# Patient Record
Sex: Female | Born: 1985
Health system: Southern US, Community
[De-identification: ages and names within clinical notes are randomized; demographics above are authoritative.]

## PROBLEM LIST (undated history)

## (undated) DIAGNOSIS — G8929 Other chronic pain: Secondary | ICD-10-CM

## (undated) DIAGNOSIS — M549 Dorsalgia, unspecified: Secondary | ICD-10-CM

## (undated) HISTORY — PX: HERNIA REPAIR: SHX51

---

## 2014-05-10 ENCOUNTER — Emergency Department (HOSPITAL_BASED_OUTPATIENT_CLINIC_OR_DEPARTMENT_OTHER)
Admission: EM | Admit: 2014-05-10 | Discharge: 2014-05-10 | Disposition: A | Discharge status: Home / Self Care | Payer: Medicaid Other | Attending: Emergency Medicine | Admitting: Emergency Medicine

## 2014-05-10 ENCOUNTER — Encounter (HOSPITAL_BASED_OUTPATIENT_CLINIC_OR_DEPARTMENT_OTHER): Payer: Self-pay | Admitting: Emergency Medicine

## 2014-05-10 DIAGNOSIS — L0502 Pilonidal sinus with abscess: Secondary | ICD-10-CM | POA: Diagnosis not present

## 2014-05-10 DIAGNOSIS — Z79899 Other long term (current) drug therapy: Secondary | ICD-10-CM | POA: Diagnosis not present

## 2014-05-10 DIAGNOSIS — L0231 Cutaneous abscess of buttock: Secondary | ICD-10-CM | POA: Diagnosis present

## 2014-05-10 DIAGNOSIS — L0501 Pilonidal cyst with abscess: Secondary | ICD-10-CM

## 2014-05-10 MED ORDER — HYDROCODONE-ACETAMINOPHEN 5-325 MG PO TABS
1.0000 | ORAL_TABLET | ORAL | Status: DC | PRN
Start: 2014-05-10 — End: 2016-04-05

## 2014-05-10 MED ORDER — LIDOCAINE-EPINEPHRINE (PF) 2 %-1:200000 IJ SOLN
10.0000 mL | Freq: Once | INTRAMUSCULAR | Status: AC
  Administered 2014-05-10: 10 mL via INTRADERMAL
  Filled 2014-05-10: qty 20

## 2014-05-10 MED ORDER — SULFAMETHOXAZOLE-TRIMETHOPRIM 800-160 MG PO TABS: 1.0000 | ORAL_TABLET | Freq: Two times a day (BID) | ORAL | Status: AC

## 2014-05-10 MED ORDER — OXYCODONE-ACETAMINOPHEN 5-325 MG PO TABS
2.0000 | ORAL_TABLET | Freq: Once | ORAL | Status: AC
  Administered 2014-05-10: 2 via ORAL
  Filled 2014-05-10: qty 2

## 2014-05-10 NOTE — ED Provider Notes (Signed)
Medical screening examination/treatment/procedure(s) were performed by non-physician practitioner and as supervising physician I was immediately available for consultation/collaboration.   EKG Interpretation None        Glynn OctaveStephen Lariza Cothron, MD 05/10/14 2245

## 2014-05-10 NOTE — ED Provider Notes (Signed)
CSN: [id number]     Arrival date & time 05/10/14  2046 History   First MD Initiated Contact with Patient 05/10/14 2116     Chief Complaint  Patient presents with  . Abscess     (Consider location/radiation/quality/duration/timing/severity/associated sxs/prior Treatment) HPI Comments: This is a 28 year old female who presents to the emergency department stating she has an abscess to her right buttock area that has been present for about 3 weeks. She reports the area initially had a small pimple and believes it has gotten larger. She reports pain with pressure. Denies drainage. No fevers. States she used to get abscesses to her armpits.  Patient is a 28 y.o. female presenting with abscess. The history is provided by the patient.  Abscess   History reviewed. No pertinent past medical history. Past Surgical History  Procedure Laterality Date  . Hernia repair    . Cesarean section     History reviewed. No pertinent family history. History  Substance Use Topics  . Smoking status: Never Smoker   . Smokeless tobacco: Not on file  . Alcohol Use: No   OB History   Grav Para Term Preterm Abortions TAB SAB Ect Mult Living                 Review of Systems  Skin:       + Abscess.  All other systems reviewed and are negative.     Allergies  Review of patient's allergies indicates no known allergies.  Home Medications   Prior to Admission medications   Medication Sig Start Date End Date Taking? Authorizing Provider  Norethin-Eth Estradiol-Fe [facility](FEMCON FE PO) Take by mouth.   Yes Historical Provider, MD  HYDROcodone-acetaminophen (NORCO/VICODIN) 5-325 MG per tablet Take 1-2 tablets by mouth every 4 (four) hours as needed. 05/10/14   Jazion Atteberry M Jamas Jaquay, PA-C  sulfamethoxazole-trimethoprim (BACTRIM DS,SEPTRA DS) 800-160 MG per tablet Take 1 tablet by mouth 2 (two) times daily. 05/10/14 05/17/14  Kordel Leavy M Ostin Mathey, PA-C   BP 119/93  Pulse 80  Temp(Src) 98.1 F (36.7 C) (Oral)  Resp 21  Ht  5\' 10"  (1.778 m)  Wt 210 lb (95.255 kg)  BMI 30.13 kg/m2  SpO2 100%  LMP 05/05/2014 Physical Exam  Nursing note and vitals reviewed. Constitutional: She is oriented to person, place, and time. She appears well-developed and well-nourished. No distress.  HENT:  Head: Normocephalic and atraumatic.  Mouth/Throat: Oropharynx is clear and moist.  Eyes: Conjunctivae and EOM are normal.  Neck: Normal range of motion. Neck supple.  Cardiovascular: Normal rate, regular rhythm and normal heart sounds.   Pulmonary/Chest: Effort normal and breath sounds normal. No respiratory distress.  Genitourinary:     Musculoskeletal: Normal range of motion. She exhibits no edema.  Neurological: She is alert and oriented to person, place, and time. No sensory deficit.  Skin: Skin is warm and dry.  Psychiatric: She has a normal mood and affect. Her behavior is normal.    ED Course  Procedures (including critical care time) INCISION AND DRAINAGE Performed by: Celene SkeenHess, Darianne Muralles Consent: Verbal consent obtained. Risks and benefits: risks, benefits and alternatives were discussed Type: abscess  Body area: left buttock area  Anesthesia: local infiltration  Incision was made with a scalpel.  Local anesthetic: lidocaine 2% with epinephrine  Anesthetic total: 3 ml  Complexity: complex Blunt dissection to break up loculations  Drainage: purulent  Drainage amount: large  Packing material: none  Patient tolerance: Patient tolerated the procedure well with no immediate complications.  Labs Review Labs Reviewed - No data to display  Imaging Review No results found.   EKG Interpretation None      MDM   Final diagnoses:  Pilonidal abscess   Patient nontoxic appearing and in no apparent distress. Afebrile, vital signs stable. No cellulitis. Large amount of purulent drainage. Advised warm compresses. Given area of abscess, will treat with antibiotics. Followup with PCP for recheck. Stable  for discharge. Return precautions given. Patient states understanding of treatment care plan and is agreeable.   Kathrynn SpeedRobyn M Tavon Corriher, PA-C 05/10/14 2214

## 2014-05-10 NOTE — Discharge Instructions (Signed)
Take Vicodin for severe pain only. No driving or operating heavy machinery while taking vicodin. This medication may cause drowsiness. Take antibiotic to completion. Apply warm compresses.  Abscess Care After An abscess (also called a boil or furuncle) is an infected area that contains a collection of pus. Signs and symptoms of an abscess include pain, tenderness, redness, or hardness, or you may feel a moveable soft area under your skin. An abscess can occur anywhere in the body. The infection may spread to surrounding tissues causing cellulitis. A cut (incision) by the surgeon was made over your abscess and the pus was drained out. Gauze may have been packed into the space to provide a drain that will allow the cavity to heal from the inside outwards. The boil may be painful for 5 to 7 days. Most people with a boil do not have high fevers. Your abscess, if seen early, may not have localized, and may not have been lanced. If not, another appointment may be required for this if it does not get better on its own or with medications. HOME CARE INSTRUCTIONS   Only take over-the-counter or prescription medicines for pain, discomfort, or fever as directed by your caregiver.  When you bathe, soak and then remove gauze or iodoform packs at least daily or as directed by your caregiver. You may then wash the wound gently with mild soapy water. Repack with gauze or do as your caregiver directs. SEEK IMMEDIATE MEDICAL CARE IF:   You develop increased pain, swelling, redness, drainage, or bleeding in the wound site.  You develop signs of generalized infection including muscle aches, chills, fever, or a general ill feeling.  An oral temperature above 102 F (38.9 C) develops, not controlled by medication. See your caregiver for a recheck if you develop any of the symptoms described above. If medications (antibiotics) were prescribed, take them as directed. Document Released: 01/27/2005 Document Revised:  10/03/2011 Document Reviewed: 09/24/2007 [facility] Patient Information 2015 [place], [place]. This information is not intended to replace advice given to you by your health care provider. Make sure you discuss any questions you have with your health care provider.  Pilonidal Cyst A pilonidal cyst occurs when hairs get trapped (ingrown) beneath the skin in the crease between the buttocks over your sacrum (the bone under that crease). Pilonidal cysts are most common in young men with a lot of body hair. When the cyst is ruptured (breaks) or leaking, fluid from the cyst may cause burning and itching. If the cyst becomes infected, it causes a painful swelling filled with pus (abscess). The pus and trapped hairs need to be removed (often by lancing) so that the infection can heal. However, recurrence is common and an operation may be needed to remove the cyst. HOME CARE INSTRUCTIONS   If the cyst was NOT INFECTED:  Keep the area clean and dry. Bathe or shower daily. Wash the area well with a germ-killing soap. Warm tub baths may help prevent infection and help with drainage. Dry the area well with a towel.  Avoid tight clothing to keep area as moisture free as possible.  Keep area between buttocks as free of hair as possible. A depilatory may be used.  If the cyst WAS INFECTED and needed to be drained:  Your caregiver packed the wound with gauze to keep the wound open. This allows the wound to heal from the inside outwards and continue draining.  Return for a wound check in 1 day or as suggested.  If  you take tub baths or showers, repack the wound with gauze following them. Sponge baths (at the sink) are a good alternative.  If an antibiotic was ordered to fight the infection, take as directed.  Only take over-the-counter or prescription medicines for pain, discomfort, or fever as directed by your caregiver.  After the drain is removed, use sitz baths for 20 minutes 4 times per day. Clean the  wound gently with mild unscented soap, pat dry, and then apply a dry dressing. SEEK MEDICAL CARE IF:   You have increased pain, swelling, redness, drainage, or bleeding from the area.  You have a fever.  You have muscles aches, dizziness, or a general ill feeling. Document Released: 07/08/2000 Document Revised: 10/03/2011 Document Reviewed: 09/05/2008 [facility] Patient Information 2015 [place]. This information is not intended to replace advice given to you by your health care provider. Make sure you discuss any questions you have with your health care provider.  Abscess An abscess is an infected area that contains a collection of pus and debris.It can occur in almost any part of the body. An abscess is also known as a furuncle or boil. CAUSES  An abscess occurs when tissue gets infected. This can occur from blockage of oil or sweat glands, infection of hair follicles, or a minor injury to the skin. As the body tries to fight the infection, pus collects in the area and creates pressure under the skin. This pressure causes pain. People with weakened immune systems have difficulty fighting infections and get certain abscesses more often.  SYMPTOMS Usually an abscess develops on the skin and becomes a painful mass that is red, warm, and tender. If the abscess forms under the skin, you may feel a moveable soft area under the skin. Some abscesses break open (rupture) on their own, but most will continue to get worse without care. The infection can spread deeper into the body and eventually into the bloodstream, causing you to feel ill.  DIAGNOSIS  Your caregiver will take your medical history and perform a physical exam. A sample of fluid may also be taken from the abscess to determine what is causing your infection. TREATMENT  Your caregiver may prescribe antibiotic medicines to fight the infection. However, taking antibiotics alone usually does not cure an abscess. Your caregiver may need to  make a small cut (incision) in the abscess to drain the pus. In some cases, gauze is packed into the abscess to reduce pain and to continue draining the area. HOME CARE INSTRUCTIONS   Only take over-the-counter or prescription medicines for pain, discomfort, or fever as directed by your caregiver.  If you were prescribed antibiotics, take them as directed. Finish them even if you start to feel better.  If gauze is used, follow your caregiver's directions for changing the gauze.  To avoid spreading the infection:  Keep your draining abscess covered with a bandage.  Wash your hands well.  Do not share personal care items, towels, or whirlpools with others.  Avoid skin contact with others.  Keep your skin and clothes clean around the abscess.  Keep all follow-up appointments as directed by your caregiver. SEEK MEDICAL CARE IF:   You have increased pain, swelling, redness, fluid drainage, or bleeding.  You have muscle aches, chills, or a general ill feeling.  You have a fever. MAKE SURE YOU:   Understand these instructions.  Will watch your condition.  Will get help right away if you are not doing well or get worse.  Document Released: 04/20/2005 Document Revised: 01/10/2012 Document Reviewed: 09/23/2011 [facility] Patient Information 2015 [place], [place]. This information is not intended to replace advice given to you by your health care provider. Make sure you discuss any questions you have with your health care provider.

## 2014-05-10 NOTE — ED Notes (Signed)
Pt reports she has an abscess to right buttocks x 3 weeks.

## 2015-12-20 ENCOUNTER — Emergency Department (HOSPITAL_BASED_OUTPATIENT_CLINIC_OR_DEPARTMENT_OTHER)
Admission: EM | Admit: 2015-12-20 | Discharge: 2015-12-20 | Disposition: A | Discharge status: Home / Self Care | Payer: No Typology Code available for payment source | Attending: Emergency Medicine | Admitting: Emergency Medicine

## 2015-12-20 ENCOUNTER — Emergency Department (HOSPITAL_BASED_OUTPATIENT_CLINIC_OR_DEPARTMENT_OTHER): Payer: No Typology Code available for payment source

## 2015-12-20 ENCOUNTER — Encounter (HOSPITAL_BASED_OUTPATIENT_CLINIC_OR_DEPARTMENT_OTHER): Payer: Self-pay | Admitting: Emergency Medicine

## 2015-12-20 DIAGNOSIS — S0993XA Unspecified injury of face, initial encounter: Secondary | ICD-10-CM | POA: Diagnosis present

## 2015-12-20 DIAGNOSIS — Y92410 Unspecified street and highway as the place of occurrence of the external cause: Secondary | ICD-10-CM | POA: Diagnosis not present

## 2015-12-20 DIAGNOSIS — M549 Dorsalgia, unspecified: Secondary | ICD-10-CM | POA: Insufficient documentation

## 2015-12-20 DIAGNOSIS — S80212A Abrasion, left knee, initial encounter: Secondary | ICD-10-CM | POA: Diagnosis not present

## 2015-12-20 DIAGNOSIS — Y939 Activity, unspecified: Secondary | ICD-10-CM | POA: Insufficient documentation

## 2015-12-20 DIAGNOSIS — Y999 Unspecified external cause status: Secondary | ICD-10-CM | POA: Insufficient documentation

## 2015-12-20 DIAGNOSIS — S0083XA Contusion of other part of head, initial encounter: Secondary | ICD-10-CM | POA: Insufficient documentation

## 2015-12-20 MED ORDER — NAPROXEN 500 MG PO TABS: 500.0000 mg | ORAL_TABLET | Freq: Two times a day (BID) | ORAL | Status: DC

## 2015-12-20 MED ORDER — OXYCODONE HCL 5 MG PO TABS: 5.0000 mg | ORAL_TABLET | ORAL | Status: DC | PRN

## 2015-12-20 MED ORDER — TETANUS-DIPHTH-ACELL PERTUSSIS 5-2.5-18.5 LF-MCG/0.5 IM SUSP: 0.5000 mL | Freq: Once | INTRAMUSCULAR | Status: DC

## 2015-12-20 MED ORDER — BACITRACIN ZINC 500 UNIT/GM EX OINT
1.0000 "application " | TOPICAL_OINTMENT | Freq: Two times a day (BID) | CUTANEOUS | Status: DC
  Administered 2015-12-20: 1 via TOPICAL

## 2015-12-20 MED ORDER — METHOCARBAMOL 500 MG PO TABS: 500.0000 mg | ORAL_TABLET | Freq: Two times a day (BID) | ORAL | Status: DC
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
## 2015-12-20 NOTE — Discharge Instructions (Signed)
Take your medications as prescribed as needed for pain relief. I also recommend resting and applying ice to affected area for 15-20 minutes 3-4 times daily to help with pain and swelling. Keep wound clean using antibacterial soap (Dial) and water and pat dry. You may apply a small amount of antibiotic ointment, bacitracin, to wound daily. Follow-up with your primary care provider in the next week for wound recheck. Please return to the Emergency Department if symptoms worsen or new onset of fever, numbness, tingling, groin numbness, abdominal pain, vomiting, urinary retention, loss of bowel or bladder, weakness, redness, swelling, warmth, drainage.
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
## 2016-04-05 ENCOUNTER — Emergency Department (HOSPITAL_BASED_OUTPATIENT_CLINIC_OR_DEPARTMENT_OTHER)
Admission: EM | Admit: 2016-04-05 | Discharge: 2016-04-05 | Disposition: A | Discharge status: Home / Self Care | Payer: Medicaid Other | Attending: Emergency Medicine | Admitting: Emergency Medicine

## 2016-04-05 ENCOUNTER — Encounter (HOSPITAL_BASED_OUTPATIENT_CLINIC_OR_DEPARTMENT_OTHER): Payer: Self-pay | Admitting: *Deleted

## 2016-04-05 ENCOUNTER — Emergency Department (HOSPITAL_BASED_OUTPATIENT_CLINIC_OR_DEPARTMENT_OTHER): Payer: Medicaid Other

## 2016-04-05 DIAGNOSIS — R51 Headache: Secondary | ICD-10-CM | POA: Insufficient documentation

## 2016-04-05 DIAGNOSIS — R519 Headache, unspecified: Secondary | ICD-10-CM

## 2016-04-05 DIAGNOSIS — Z79818 Long term (current) use of other agents affecting estrogen receptors and estrogen levels: Secondary | ICD-10-CM | POA: Diagnosis not present

## 2016-04-05 LAB — PREGNANCY, URINE: Preg Test, Ur: NEGATIVE

## 2016-04-05 MED ORDER — SODIUM CHLORIDE 0.9 % IV BOLUS (SEPSIS): 1000.0000 mL | Freq: Once | INTRAVENOUS | Status: DC

## 2016-04-05 MED ORDER — KETOROLAC TROMETHAMINE 30 MG/ML IJ SOLN
60.0000 mg | Freq: Once | INTRAMUSCULAR | Status: DC
  Filled 2016-04-05: qty 2

## 2016-04-05 MED ORDER — KETOROLAC TROMETHAMINE 60 MG/2ML IM SOLN
60.0000 mg | Freq: Once | INTRAMUSCULAR | Status: AC
  Administered 2016-04-05: 60 mg via INTRAMUSCULAR
  Filled 2016-04-05: qty 2

## 2016-04-05 MED ORDER — IBUPROFEN 800 MG PO TABS: 800.0000 mg | ORAL_TABLET | Freq: Three times a day (TID) | ORAL | 0 refills | Status: DC | PRN

## 2016-04-05 MED FILL — IBUPROFEN 800 MG TABLET: 800 | 7 days supply | Qty: 21 | Fill #0

## 2016-04-05 NOTE — ED Triage Notes (Signed)
Co back of neck and all over h/a x 3 weeks. Pt describes pain as intense. Saw MD last week for same. Pt playing on phone on arrival to room.

## 2016-04-05 NOTE — ED Provider Notes (Signed)
[facility]   CSN: [id number] Arrival date & time: 04/05/16  [id number]     History   Chief Complaint Chief Complaint  Patient presents with  . Headache    HPI Heather Boyle is a 30 y.o. female.  HPI Patient presents to the emergency department with bilateral headache over the last 3 weeks.  The patient states that she has had bilateral headache that seems to come and go.  She states that at its worse.  It lasts 10 minutes at a time.  She has does not always completely resolved.  Patient, states she has taken Tylenol and Motrin without significant relief of her symptoms.  Patient states that she was seen by her GYN doctor and referred here for further evaluation. The patient denies chest pain, shortness of breath, blurred vision, neck pain, fever, cough, weakness, numbness, dizziness, anorexia, edema, abdominal pain, nausea, vomiting, diarrhea, rash, back pain, dysuria, hematemesis, bloody stool, near syncope, or syncope. History reviewed. No pertinent past medical history.  There are no active problems to display for this patient.   Past Surgical History:  Procedure Laterality Date  . CESAREAN SECTION    . HERNIA REPAIR      OB History    No data available       Home Medications    Prior to Admission medications   Medication Sig Start Date End Date Taking? Authorizing Provider  Norethin-Eth Estradiol-Fe [facility](FEMCON FE PO) Take by mouth.   Yes Historical Provider, MD    Family History No family history on file.  Social History Social History  Substance Use Topics  . Smoking status: Never Smoker  . Smokeless tobacco: Never Used  . Alcohol use No     Allergies   Review of patient's allergies indicates no known allergies.   Review of Systems Review of Systems  All other systems negative except as documented in the HPI. All pertinent positives and negatives as reviewed in the HPI. Physical Exam Updated Vital Signs BP 120/86 (BP Location:  Left Arm)   Pulse 81   Temp 98.5 F (36.9 C) (Oral)   Resp 18   Ht 5\' 10"  (1.778 m)   Wt 99.3 kg   LMP 03/29/2016   SpO2 99%   BMI 31.42 kg/m   Physical Exam  Constitutional: She is oriented to person, place, and time. She appears well-developed and well-nourished. No distress.  HENT:  Head: Normocephalic and atraumatic.  Mouth/Throat: Oropharynx is clear and moist.  Eyes: Pupils are equal, round, and reactive to light.  Neck: Normal range of motion. Neck supple.  Cardiovascular: Normal rate, regular rhythm and normal heart sounds.  Exam reveals no gallop and no friction rub.   No murmur heard. Pulmonary/Chest: Effort normal and breath sounds normal. No respiratory distress. She has no wheezes.  Abdominal: Soft. Bowel sounds are normal. She exhibits no distension. There is no tenderness.  Neurological: She is alert and oriented to person, place, and time. She exhibits normal muscle tone. Coordination normal.  Skin: Skin is warm and dry. Capillary refill takes less than 2 seconds. No rash noted. No erythema.  Psychiatric: She has a normal mood and affect. Her behavior is normal.  Nursing note and vitals reviewed.    ED Treatments / Results  Labs (all labs ordered are listed, but only abnormal results are displayed) Labs Reviewed  PREGNANCY, URINE    EKG  EKG Interpretation None       Radiology Ct Head Wo Contrast  Result Date: 04/05/2016 CLINICAL DATA:  Headache and neck pain for 3 weeks. No known injury. EXAM: CT HEAD WITHOUT CONTRAST TECHNIQUE: Contiguous axial images were obtained from the base of the skull through the vertex without intravenous contrast. COMPARISON:  None. FINDINGS: Brain: The brain appears normal without hemorrhage, infarct, mass lesion, mass effect, midline shift or abnormal extra-axial fluid collection. No hydrocephalus or pneumocephalus. Vascular: Unremarkable. Skull: Appears normal. Sinuses/Orbits: Appear normal. Other: None. IMPRESSION: Normal  head CT. Electronically Signed   By: Drusilla Kanner M.D.   On: 04/05/2016 10:21    Procedures Procedures (including critical care time)  Medications Ordered in ED Medications  ketorolac (TORADOL) 30 MG/ML injection 60 mg (not administered)     Initial Impression / Assessment and Plan / ED Course  I have reviewed the triage vital signs and the nursing notes.  Pertinent labs & imaging results that were available during my care of the patient were reviewed by me and considered in my medical decision making (see chart for details).  Clinical Course    Advised the patient of the results and advised her that she will need follow-up with ophthalmology to further evaluate if there is any vision issues causing her headache.  Patient agrees the plan and all questions were answered  Final Clinical Impressions(s) / ED Diagnoses   Final diagnoses:  None    New Prescriptions New Prescriptions   No medications on file      Charlestine Night, PA-C 04/05/16 1059    Benjiman Core, MD 04/05/16 1513

## 2016-04-05 NOTE — Discharge Instructions (Signed)
Return here as needed.  Follow-up with the ophthalmologist provided. °

## 2016-12-11 ENCOUNTER — Emergency Department (HOSPITAL_BASED_OUTPATIENT_CLINIC_OR_DEPARTMENT_OTHER)
Admission: EM | Admit: 2016-12-11 | Discharge: 2016-12-11 | Disposition: A | Discharge status: Home / Self Care | Payer: Medicaid Other | Attending: Emergency Medicine | Admitting: Emergency Medicine

## 2016-12-11 ENCOUNTER — Encounter (HOSPITAL_BASED_OUTPATIENT_CLINIC_OR_DEPARTMENT_OTHER): Payer: Self-pay | Admitting: *Deleted

## 2016-12-11 DIAGNOSIS — M545 Low back pain, unspecified: Secondary | ICD-10-CM

## 2016-12-11 DIAGNOSIS — M549 Dorsalgia, unspecified: Secondary | ICD-10-CM | POA: Diagnosis present

## 2016-12-11 HISTORY — DX: Dorsalgia, unspecified: M54.9

## 2016-12-11 HISTORY — DX: Other chronic pain: G89.29

## 2016-12-11 LAB — URINALYSIS, ROUTINE W REFLEX MICROSCOPIC
Bilirubin Urine: NEGATIVE
Glucose, UA: NEGATIVE mg/dL
Hgb urine dipstick: NEGATIVE
Ketones, ur: NEGATIVE mg/dL
Nitrite: NEGATIVE
PROTEIN: NEGATIVE mg/dL
Specific Gravity, Urine: 1.011 (ref 1.005–1.030)
pH: 6.5 (ref 5.0–8.0)

## 2016-12-11 LAB — URINALYSIS, MICROSCOPIC (REFLEX)

## 2016-12-11 LAB — PREGNANCY, URINE: PREG TEST UR: NEGATIVE

## 2016-12-11 MED ORDER — IBUPROFEN 800 MG PO TABS: 800.0000 mg | ORAL_TABLET | Freq: Three times a day (TID) | ORAL | 0 refills | Status: DC | PRN

## 2016-12-11 MED ORDER — METHOCARBAMOL 500 MG PO TABS: 500.0000 mg | ORAL_TABLET | Freq: Two times a day (BID) | ORAL | 0 refills | Status: DC

## 2016-12-11 MED ORDER — LIDOCAINE 5 % EX PTCH: 1.0000 | MEDICATED_PATCH | CUTANEOUS | 0 refills | Status: DC

## 2016-12-11 NOTE — ED Provider Notes (Signed)
Emergency Department Provider Note   I have reviewed the triage vital signs and the nursing notes.   HISTORY  Chief Complaint Back Pain   HPI Heather Boyle is a 31 y.o. female presents to the ED for evaluation of lower back pain. Symptoms began 2 days prior and started on the left but are now midline lower back. No radiation to the legs. No dysuria, hesitancy, urgency. No vaginal bleeding or discharge. Patient has tried over-the-counter medications and heating packs with no relief. Symptoms are intermitted and moderate in severity. No vaginal bleeding or discharge.    Past Medical History:  Diagnosis Date  . Chronic back pain     There are no active problems to display for this patient.   Past Surgical History:  Procedure Laterality Date  . CESAREAN SECTION    . HERNIA REPAIR      Current Outpatient Rx  . Order #: [id number] Class: Print  . Order #: [id number] Class: Print  . Order #: [id number] Class: Print  . Order #: [id number] Class: Historical Med    Allergies Patient has no known allergies.  No family history on file.  Social History Social History  Substance Use Topics  . Smoking status: Never Smoker  . Smokeless tobacco: Never Used  . Alcohol use No    Review of Systems  Constitutional: No fever/chills Eyes: No visual changes. ENT: No sore throat. Cardiovascular: Denies chest pain. Respiratory: Denies shortness of breath. Gastrointestinal: No abdominal pain.  No nausea, no vomiting.  No diarrhea.  No constipation. Genitourinary: Negative for dysuria. Musculoskeletal: Positive for back pain. Skin: Negative for rash. Neurological: Negative for headaches, focal weakness or numbness.  10-point ROS otherwise negative.  ____________________________________________   PHYSICAL EXAM:  VITAL SIGNS: ED Triage Vitals  Enc Vitals Group     BP 12/11/16 1213 129/87     Pulse Rate 12/11/16 1213 90     Resp 12/11/16 1213 20     Temp 12/11/16 1213 98.7  F (37.1 C)     Temp Source 12/11/16 1213 Oral     SpO2 12/11/16 1213 100 %     Weight 12/11/16 1227 207 lb (93.9 kg)     Pain Score 12/11/16 1226 10   Constitutional: Alert and oriented. Well appearing and in no acute distress. Eyes: Conjunctivae are normal.  Head: Atraumatic. Nose: No congestion/rhinnorhea. Mouth/Throat: Mucous membranes are moist.  Neck: No stridor.  Cardiovascular: Normal rate, regular rhythm. Good peripheral circulation. Grossly normal heart sounds.   Respiratory: Normal respiratory effort.  No retractions. Lungs CTAB. Gastrointestinal: Soft and nontender. No distention.  Musculoskeletal: No lower extremity tenderness nor edema. No gross deformities of extremities. Mild paraspinal tenderness to palpation.  Neurologic:  Normal speech and language. No gross focal neurologic deficits are appreciated.  Skin:  Skin is warm, dry and intact. No rash noted.   ____________________________________________   LABS (all labs ordered are listed, but only abnormal results are displayed)  Labs Reviewed  URINALYSIS, ROUTINE W REFLEX MICROSCOPIC - Abnormal; Notable for the following:       Result Value   Leukocytes, UA TRACE (*)    All other components within normal limits  URINALYSIS, MICROSCOPIC (REFLEX) - Abnormal; Notable for the following:    Bacteria, UA RARE (*)    Squamous Epithelial / LPF 0-5 (*)    All other components within normal limits  PREGNANCY, URINE   ____________________________________________   PROCEDURES  Procedure(s) performed:   Procedures  None ____________________________________________   INITIAL IMPRESSION / ASSESSMENT  AND PLAN / ED COURSE  Pertinent labs & imaging results that were available during my care of the patient were reviewed by me and considered in my medical decision making (see chart for details).  Patient presents to the ED for evaluation of lower back pain. No red-flag symptoms or neuro deficits to indicate spine  imaging.   Differential diagnosis includes but is not exclusive to musculoskeletal back pain, renal colic, urinary tract infection, pyelonephritis, intra-abdominal causes of back pain, aortic aneurysm or dissection, cauda equina syndrome, sciatica, lumbar disc disease, thoracic disc disease, etc.  Plan for conservative mgmt at home.   At this time, I do not feel there is any life-threatening condition present. I have reviewed and discussed all results (EKG, imaging, lab, urine as appropriate), exam findings with patient. I have reviewed nursing notes and appropriate previous records.  I feel the patient is safe to be discharged home without further emergent workup. Discussed usual and customary return precautions. Patient and family (if present) verbalize understanding and are comfortable with this plan.  Patient will follow-up with their primary care provider. If they do not have a primary care provider, information for follow-up has been provided to them. All questions have been answered.  ____________________________________________  FINAL CLINICAL IMPRESSION(S) / ED DIAGNOSES  Final diagnoses:  Acute midline low back pain without sciatica     MEDICATIONS GIVEN DURING THIS VISIT:  Medications - No data to display   NEW OUTPATIENT MEDICATIONS STARTED DURING THIS VISIT:  Discharge Medication List as of 12/11/2016  3:09 PM    START taking these medications   Details  lidocaine (LIDODERM) 5 % Place 1 patch onto the skin daily. Remove & Discard patch within 12 hours or as directed by MD, Starting Sun 12/11/2016, Print    methocarbamol (ROBAXIN) 500 MG tablet Take 1 tablet (500 mg total) by mouth 2 (two) times daily., Starting Sun 12/11/2016, Print          Note:  This document was prepared using Dragon voice recognition software and may include unintentional dictation errors.  Alona BeneJoshua Bernadean Saling, MD Emergency Medicine    Salvator Seppala, Arlyss RepressJoshua G, MD 12/12/16 [phone]
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
## 2016-12-11 NOTE — Discharge Instructions (Signed)

## 2017-05-03 ENCOUNTER — Emergency Department (HOSPITAL_BASED_OUTPATIENT_CLINIC_OR_DEPARTMENT_OTHER)
Admission: EM | Admit: 2017-05-03 | Discharge: 2017-05-03 | Disposition: A | Discharge status: Home / Self Care | Payer: Medicaid Other | Attending: Physician Assistant | Admitting: Physician Assistant

## 2017-05-03 ENCOUNTER — Encounter (HOSPITAL_BASED_OUTPATIENT_CLINIC_OR_DEPARTMENT_OTHER): Payer: Self-pay

## 2017-05-03 DIAGNOSIS — M545 Low back pain, unspecified: Secondary | ICD-10-CM

## 2017-05-03 LAB — COMPREHENSIVE METABOLIC PANEL
ALT: 15 U/L (ref 14–54)
AST: 21 U/L (ref 15–41)
Albumin: 3.5 g/dL (ref 3.5–5.0)
Alkaline Phosphatase: 48 U/L (ref 38–126)
Anion gap: 6 (ref 5–15)
BUN: 8 mg/dL (ref 6–20)
CHLORIDE: 101 mmol/L (ref 101–111)
CO2: 24 mmol/L (ref 22–32)
Calcium: 8.3 mg/dL — ABNORMAL LOW (ref 8.9–10.3)
Creatinine, Ser: 0.9 mg/dL (ref 0.44–1.00)
GFR calc non Af Amer: >60 mL/min (ref >60)
Glucose, Bld: 98 mg/dL (ref 65–99)
POTASSIUM: 3.2 mmol/L — AB (ref 3.5–5.1)
Sodium: 131 mmol/L — ABNORMAL LOW (ref 135–145)
Total Bilirubin: 0.6 mg/dL (ref 0.3–1.2)
Total Protein: 6.9 g/dL (ref 6.5–8.1)

## 2017-05-03 LAB — CBC WITH DIFFERENTIAL/PLATELET
Basophils Absolute: 0 10*3/uL (ref 0.0–0.1)
Basophils Relative: 0 %
Eosinophils Absolute: 0 10*3/uL (ref 0.0–0.7)
Eosinophils Relative: 0 %
HCT: 37.4 % (ref 36.0–46.0)
Hemoglobin: 13 g/dL (ref 12.0–15.0)
LYMPHS ABS: 1.6 10*3/uL (ref 0.7–4.0)
LYMPHS PCT: 16 %
MCH: 29.7 pg (ref 26.0–34.0)
MCHC: 34.8 g/dL (ref 30.0–36.0)
MCV: 85.4 fL (ref 78.0–100.0)
Monocytes Absolute: 0.7 10*3/uL (ref 0.1–1.0)
Monocytes Relative: 7 %
NEUTROS PCT: 77 %
Neutro Abs: 7.7 10*3/uL (ref 1.7–7.7)
Platelets: 185 10*3/uL (ref 150–400)
RBC: 4.38 MIL/uL (ref 3.87–5.11)
RDW: 12.9 % (ref 11.5–15.5)
WBC: 10 10*3/uL (ref 4.0–10.5)

## 2017-05-03 LAB — URINALYSIS, MICROSCOPIC (REFLEX)

## 2017-05-03 LAB — URINALYSIS, ROUTINE W REFLEX MICROSCOPIC
Bilirubin Urine: NEGATIVE
GLUCOSE, UA: NEGATIVE mg/dL
Ketones, ur: NEGATIVE mg/dL
Nitrite: NEGATIVE
Protein, ur: NEGATIVE mg/dL
pH: 6.5 (ref 5.0–8.0)

## 2017-05-03 LAB — PREGNANCY, URINE: Preg Test, Ur: NEGATIVE

## 2017-05-03 MED ORDER — CYCLOBENZAPRINE HCL 10 MG PO TABS: 10.0000 mg | ORAL_TABLET | Freq: Two times a day (BID) | ORAL | 0 refills | Status: AC | PRN

## 2017-05-03 MED ORDER — IBUPROFEN 800 MG PO TABS: 800.0000 mg | ORAL_TABLET | Freq: Three times a day (TID) | ORAL | 0 refills | Status: DC

## 2017-05-03 MED ORDER — IBUPROFEN 800 MG PO TABS
800.0000 mg | ORAL_TABLET | Freq: Once | ORAL | Status: AC
  Administered 2017-05-03: 800 mg via ORAL
  Filled 2017-05-03: qty 1

## 2017-05-03 MED FILL — IBUPROFEN 800 MG TABS: 800 | 7 days supply | Qty: 21 | Fill #0

## 2017-05-03 MED FILL — CYCLOBENZAPRINE 10 MG TAB: 10 | 5 days supply | Qty: 10 | Fill #0

## 2017-05-03 NOTE — ED Provider Notes (Signed)
[facility]   CSN: [id number] Arrival date & time: 05/03/17  1531     History   Chief Complaint Chief Complaint  Patient presents with  . Back Pain    HPI Heather Boyle is a 31 y.o. female.  HPI   31 yo with chronic back pain presentignw ith the same. She ran out of 800 motrins on Monday. Has had intermittent back pain. BEtter with motrin/muscle relaxants. Here with mom who is very concerned because she ahd appendicitis and gallbladder removal and "both times she had back pain".  Pt has no nausea, occasionally has haeadaches. Occasionally abdominal pain (last time, 3 weeks ago), no fevers.    Past Medical History:  Diagnosis Date  . Chronic back pain     There are no active problems to display for this patient.   Past Surgical History:  Procedure Laterality Date  . CESAREAN SECTION    . HERNIA REPAIR      OB History    No data available       Home Medications    Prior to Admission medications   Medication Sig Start Date End Date Taking? Authorizing Provider  ibuprofen (ADVIL,MOTRIN) 800 MG tablet Take 1 tablet (800 mg total) by mouth every 8 (eight) hours as needed. 12/11/16   Long, Arlyss Repress, MD  methocarbamol (ROBAXIN) 500 MG tablet Take 1 tablet (500 mg total) by mouth 2 (two) times daily. 12/11/16   Long, Arlyss Repress, MD  Norethin-Eth Estradiol-Fe [facility] FE PO) Take by mouth.    [provider]    Family History No family history on file.  Social History Social History  Substance Use Topics  . Smoking status: Never Smoker  . Smokeless tobacco: Never Used  . Alcohol use No     Allergies   Patient has no known allergies.   Review of Systems Review of Systems  Constitutional: Positive for fatigue. Negative for activity change and fever.  Respiratory: Negative for shortness of breath.   Cardiovascular: Negative for chest pain.  Gastrointestinal: Negative for abdominal pain.  Musculoskeletal: Positive for back  pain.     Physical Exam Updated Vital Signs BP (!) 127/96 (BP Location: Left Arm)   Pulse (!) 102   Temp 98.5 F (36.9 C) (Oral)   Resp 18   Ht  (1.753 m)   Wt 82.1 kg (181 lb)   LMP 04/24/2017   SpO2 100%   BMI 26.73 kg/m   Physical Exam  Constitutional: She is oriented to person, place, and time. She appears well-developed and well-nourished.  HENT:  Head: Normocephalic and atraumatic.  Eyes: Right eye exhibits no discharge.  Cardiovascular: Normal rate.   Pulmonary/Chest: Effort normal and breath sounds normal. No respiratory distress.  Abdominal: Soft. She exhibits no distension. There is no tenderness.  Musculoskeletal: She exhibits no edema.  Full ROM, no pain to palaption.  Neurological: She is oriented to person, place, and time. No cranial nerve deficit.  Skin: Skin is warm and dry. She is not diaphoretic.  Psychiatric: She has a normal mood and affect.  Nursing note and vitals reviewed.    ED Treatments / Results  Labs (all labs ordered are listed, but only abnormal results are displayed) Labs Reviewed  URINALYSIS, ROUTINE W REFLEX MICROSCOPIC - Abnormal; Notable for the following:       Result Value   Specific Gravity, Urine <1.005 (*)    Hgb urine dipstick SMALL (*)    Leukocytes, UA SMALL (*)  All other components within normal limits  URINALYSIS, MICROSCOPIC (REFLEX) - Abnormal; Notable for the following:    Bacteria, UA RARE (*)    Squamous Epithelial / LPF 0-5 (*)    All other components within normal limits  PREGNANCY, URINE  COMPREHENSIVE METABOLIC PANEL  CBC WITH DIFFERENTIAL/PLATELET    EKG  EKG Interpretation None       Radiology No results found.  Procedures Procedures (including critical care time)  Medications Ordered in ED Medications - No data to display   Initial Impression / Assessment and Plan / ED Course  I have reviewed the triage vital signs and the nursing notes.  Pertinent labs & imaging results that  were available during my care of the patient were reviewed by me and considered in my medical decision making (see chart for details).     Very well appearing female with what sounds like msk back pain. Mom concerned, so we will do baseline labs. Abdomen completely soft, no tenderness.  Eating and drinking nomrally. Pain occaisonlly illicited on twisting.   Normal labs. Vitals signs normal. Feels better with ibuprofen. Will have ehr follow upw with PCP. Return precuations expressed.    Final Clinical Impressions(s) / ED Diagnoses   Final diagnoses:  None    New Prescriptions New Prescriptions   No medications on file     Abelino Derrick, MD 05/04/17 [phone]

## 2017-05-03 NOTE — ED Triage Notes (Signed)
C/o back, abd pain x 4 days-NAD-steady gait

## 2017-05-03 NOTE — ED Notes (Signed)
Pt educated about sedating properties of muscle relaxer (prescription) and importance of not driving or doing other critical tasks (such as operating heavy machinery, caring for infant/toddler/child) while taking medication. Also educated about not taking with alcohol or other medications with sedating properties (such as narcotics, Benadryl). Pt/caregiver verbalized understanding.  

## 2017-05-03 NOTE — Discharge Instructions (Signed)
Please retuern with any fever, vomiting or other concerns.
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
## 2021-03-22 ENCOUNTER — Other Ambulatory Visit: Payer: Self-pay

## 2021-03-22 ENCOUNTER — Encounter (HOSPITAL_BASED_OUTPATIENT_CLINIC_OR_DEPARTMENT_OTHER): Payer: Self-pay

## 2021-03-22 ENCOUNTER — Emergency Department (HOSPITAL_BASED_OUTPATIENT_CLINIC_OR_DEPARTMENT_OTHER)
Admission: EM | Admit: 2021-03-22 | Discharge: 2021-03-22 | Disposition: A | Discharge status: Home / Self Care | Payer: Medicaid Other | Attending: Emergency Medicine | Admitting: Emergency Medicine

## 2021-03-22 DIAGNOSIS — L240 Irritant contact dermatitis due to detergents: Secondary | ICD-10-CM

## 2021-03-22 DIAGNOSIS — R21 Rash and other nonspecific skin eruption: Secondary | ICD-10-CM | POA: Diagnosis present

## 2021-03-22 MED ORDER — CETIRIZINE HCL 5 MG/5ML PO SOLN
5.0000 mg | Freq: Once | ORAL | Status: AC
  Administered 2021-03-22: 5 mg via ORAL
  Filled 2021-03-22: qty 5

## 2021-03-22 MED ORDER — HYDROCORTISONE 1 % EX CREA: TOPICAL_CREAM | CUTANEOUS | 0 refills | Status: AC

## 2021-03-22 NOTE — ED Triage Notes (Signed)
Pt states was at laundromat last Monday and started noticing a generalized rash on Thursday. Denies itching or shortness of breath. NAD in triage. Taken benadryl without relief.

## 2021-03-22 NOTE — ED Provider Notes (Signed)
[place] EMERGENCY DEPARTMENT Provider Note   CSN: [id number] Arrival date & time: 03/22/21  0865     History Chief Complaint  Patient presents with   Rash    Heather Boyle is a 35 y.o. female.   Rash Location:  Full body Quality: itchiness and redness   Quality: not blistering and not burning   Severity:  Mild Duration:  1 week Timing:  Constant Progression:  Unchanged Chronicity:  New Context: new detergent/soap   Context: not chemical exposure   Relieved by:  Antihistamines Ineffective treatments:  Antihistamines Associated symptoms: no abdominal pain, no diarrhea, no fever, no joint pain, no myalgias, no shortness of breath, no sore throat, no throat swelling and not vomiting       Past Medical History:  Diagnosis Date   Chronic back pain     There are no problems to display for this patient.   Past Surgical History:  Procedure Laterality Date   CESAREAN SECTION     HERNIA REPAIR       OB History   No obstetric history on file.     History reviewed. No pertinent family history.  Social History   Tobacco Use   Smoking status: Never   Smokeless tobacco: Never  Substance Use Topics   Alcohol use: No   Drug use: No    Home Medications Prior to Admission medications   Medication Sig Start Date End Date Taking? Authorizing Provider  hydrocortisone cream 1 % Apply to affected area 2 times daily until symptoms improve or 7 days 03/22/21  Yes Regan Lemming, MD  cyclobenzaprine (FLEXERIL) 10 MG tablet Take 1 tablet (10 mg total) by mouth 2 (two) times daily as needed for muscle spasms. 05/03/17   Mackuen, Courteney Lyn, MD  ibuprofen (ADVIL,MOTRIN) 800 MG tablet Take 1 tablet (800 mg total) by mouth every 8 (eight) hours as needed. 12/11/16   Long, Wonda Olds, MD  ibuprofen (ADVIL,MOTRIN) 800 MG tablet Take 1 tablet (800 mg total) by mouth 3 (three) times daily. 05/03/17   Mackuen, Courteney Lyn, MD  methocarbamol (ROBAXIN) 500 MG tablet  Take 1 tablet (500 mg total) by mouth 2 (two) times daily. 12/11/16   Long, Wonda Olds, MD  Norethin-Eth Estradiol-Fe [facility] FE PO) Take by mouth.    [provider]    Allergies    Patient has no known allergies.  Review of Systems   Review of Systems  Constitutional:  Negative for chills and fever.  HENT:  Negative for ear pain and sore throat.   Eyes:  Negative for pain and visual disturbance.  Respiratory:  Negative for cough and shortness of breath.   Cardiovascular:  Negative for chest pain and palpitations.  Gastrointestinal:  Negative for abdominal pain, diarrhea and vomiting.  Genitourinary:  Negative for dysuria and hematuria.  Musculoskeletal:  Negative for arthralgias, back pain and myalgias.  Skin:  Positive for rash. Negative for color change.  Neurological:  Negative for seizures and syncope.  All other systems reviewed and are negative.  Physical Exam Updated Vital Signs BP (!) 142/96 (BP Location: Left Arm)   Pulse 89   Temp 98.5 F (36.9 C) (Oral)   Resp 18   Ht 5\' 10"  (1.778 m)   Wt 117.9 kg   LMP 02/26/2021 (Exact Date)   SpO2 100%   BMI 37.31 kg/m   Physical Exam Vitals and nursing note reviewed.  Constitutional:      General: She is not in acute distress.  Appearance: She is well-developed.  HENT:     Head: Normocephalic and atraumatic.  Eyes:     Conjunctiva/sclera: Conjunctivae normal.  Cardiovascular:     Rate and Rhythm: Normal rate and regular rhythm.     Heart sounds: No murmur heard. Pulmonary:     Effort: Pulmonary effort is normal. No respiratory distress.     Breath sounds: Normal breath sounds.  Abdominal:     Palpations: Abdomen is soft.     Tenderness: There is no abdominal tenderness.  Musculoskeletal:     Cervical back: Neck supple.  Skin:    General: Skin is warm and dry.     Findings: Rash present.     Comments: Scattered papular rash, erythematous and blanching, mildly raised along the patient's bilateral lower  and upper extremities.  No mucous membrane involvement  Neurological:     Mental Status: She is alert.    ED Results / Procedures / Treatments   Labs (all labs ordered are listed, but only abnormal results are displayed) Labs Reviewed - No data to display  EKG None  Radiology No results found.  Procedures Procedures   Medications Ordered in ED Medications  cetirizine HCl (Zyrtec) 5 MG/5ML solution 5 mg (has no administration in time range)    ED Course  I have reviewed the triage vital signs and the nursing notes.  Pertinent labs & imaging results that were available during my care of the patient were reviewed by me and considered in my medical decision making (see chart for details).    MDM Rules/Calculators/A&P                           Heather Boyle is a 35 y.o. female who presents with a rash as per above. I have reviewed the nursing documentation for past medical history, family history, and social history. I have reviewed the EMR.  Patient presenting to the emergency department with a scattered rash consistent with a likely contact dermatitis in the setting of washing her close and a laundromat 1 week ago.  The patient has had persistent erythematous, mildly raised, papular rash along her extremities and chest that is mildly pruritic.  No hives.  No mucous membrane involvement.  She is awake, alert, and hemodynamically stable. her exam is most notable for a scattered papular rash, erythematous and blanching, mildly raised along the patient's bilateral lower and upper extremities.  No mucous membrane involvement.  Differential Diagnoses: There are no red flag symptoms such as fever, mucosal involvement, hypotension, toxic appearance, severe pain, immunosuppression, or a concerning medication being used. There are no physical exam findings or symptoms on ROS concerning for erythema multiforme, SJS/TEN, Lyme disease, cellulitis, necrotizing fasciitis, purupra fulminans,  angioedema, anaphylaxis, meningococcemia, DRESS, or RMSF. I do not think that the patient is septic.  I believe the patient is safe for discharge home. The patient feels safe with this plan. I prescribed hydrocortisone cream.  Stable for continued outpatient management.  I recommended the patient rewash her laundry with a different detergent to  Final Clinical Impression(s) / ED Diagnoses Final diagnoses:  Irritant contact dermatitis due to detergent  Rash    Rx / DC Orders ED Discharge Orders          Ordered    hydrocortisone cream 1 %        03/22/21 1016             Regan Lemming, MD 03/22/21 1028

## 2021-05-15 ENCOUNTER — Encounter (HOSPITAL_BASED_OUTPATIENT_CLINIC_OR_DEPARTMENT_OTHER): Payer: Self-pay | Admitting: *Deleted

## 2021-05-15 ENCOUNTER — Other Ambulatory Visit: Payer: Self-pay

## 2021-05-15 ENCOUNTER — Emergency Department (HOSPITAL_BASED_OUTPATIENT_CLINIC_OR_DEPARTMENT_OTHER)
Admission: EM | Admit: 2021-05-15 | Discharge: 2021-05-15 | Disposition: A | Discharge status: Home / Self Care | Payer: Medicaid Other | Attending: Emergency Medicine | Admitting: Emergency Medicine

## 2021-05-15 ENCOUNTER — Emergency Department (HOSPITAL_BASED_OUTPATIENT_CLINIC_OR_DEPARTMENT_OTHER): Payer: Medicaid Other

## 2021-05-15 DIAGNOSIS — M79601 Pain in right arm: Secondary | ICD-10-CM

## 2021-05-15 DIAGNOSIS — S8001XA Contusion of right knee, initial encounter: Secondary | ICD-10-CM | POA: Diagnosis not present

## 2021-05-15 DIAGNOSIS — Y92410 Unspecified street and highway as the place of occurrence of the external cause: Secondary | ICD-10-CM | POA: Diagnosis not present

## 2021-05-15 DIAGNOSIS — S8991XA Unspecified injury of right lower leg, initial encounter: Secondary | ICD-10-CM | POA: Diagnosis present

## 2021-05-15 MED ORDER — IBUPROFEN 800 MG PO TABS: 800.0000 mg | ORAL_TABLET | Freq: Three times a day (TID) | ORAL | 0 refills | Status: AC | PRN

## 2021-05-15 MED ORDER — ACETAMINOPHEN 325 MG PO TABS
650.0000 mg | ORAL_TABLET | Freq: Once | ORAL | Status: AC
  Administered 2021-05-15: 650 mg via ORAL
  Filled 2021-05-15: qty 2

## 2021-05-15 MED ORDER — DICLOFENAC SODIUM 1 % EX GEL: 2.0000 g | Freq: Four times a day (QID) | CUTANEOUS | 0 refills | Status: AC | PRN

## 2021-05-15 MED ORDER — METHOCARBAMOL 500 MG PO TABS: 500.0000 mg | ORAL_TABLET | Freq: Three times a day (TID) | ORAL | 0 refills | Status: AC | PRN

## 2021-05-15 NOTE — Discharge Instructions (Signed)
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
## 2023-06-12 ENCOUNTER — Other Ambulatory Visit (HOSPITAL_BASED_OUTPATIENT_CLINIC_OR_DEPARTMENT_OTHER): Payer: Self-pay

## 2023-06-12 ENCOUNTER — Other Ambulatory Visit: Payer: Self-pay

## 2023-06-12 MED ORDER — WEGOVY 0.25 MG/0.5ML ~~LOC~~ SOAJ
0.2500 mg | SUBCUTANEOUS | 0 refills | Status: AC
  Filled 2023-06-12 (×2): qty 2, 28d supply, fill #0

## 2023-07-11 ENCOUNTER — Other Ambulatory Visit: Payer: Self-pay

## 2023-07-11 ENCOUNTER — Other Ambulatory Visit (HOSPITAL_BASED_OUTPATIENT_CLINIC_OR_DEPARTMENT_OTHER): Payer: Self-pay

## 2023-07-11 MED ORDER — WEGOVY 0.5 MG/0.5ML ~~LOC~~ SOAJ
0.5000 mg | SUBCUTANEOUS | 0 refills | Status: DC
  Filled 2023-07-11 (×2): qty 2, 28d supply, fill #0

## 2023-07-29 ENCOUNTER — Other Ambulatory Visit (HOSPITAL_BASED_OUTPATIENT_CLINIC_OR_DEPARTMENT_OTHER): Payer: Self-pay

## 2023-07-29 MED ORDER — WEGOVY 1 MG/0.5ML ~~LOC~~ SOAJ
1.0000 mg | SUBCUTANEOUS | 0 refills | Status: DC
  Filled 2023-07-29 – 2023-08-04 (×5): qty 2, 28d supply, fill #0

## 2023-08-01 ENCOUNTER — Other Ambulatory Visit (HOSPITAL_BASED_OUTPATIENT_CLINIC_OR_DEPARTMENT_OTHER): Payer: Self-pay

## 2023-08-03 ENCOUNTER — Other Ambulatory Visit (HOSPITAL_BASED_OUTPATIENT_CLINIC_OR_DEPARTMENT_OTHER): Payer: Self-pay

## 2023-08-04 ENCOUNTER — Other Ambulatory Visit: Payer: Self-pay

## 2023-08-04 ENCOUNTER — Other Ambulatory Visit (HOSPITAL_BASED_OUTPATIENT_CLINIC_OR_DEPARTMENT_OTHER): Payer: Self-pay

## 2023-08-23 ENCOUNTER — Other Ambulatory Visit (HOSPITAL_BASED_OUTPATIENT_CLINIC_OR_DEPARTMENT_OTHER): Payer: Self-pay

## 2023-08-23 MED ORDER — WEGOVY 1.7 MG/0.75ML ~~LOC~~ SOAJ
1.7000 mg | SUBCUTANEOUS | 0 refills | Status: DC
  Filled 2023-08-23 – 2023-09-05 (×2): qty 3, 28d supply, fill #0

## 2023-09-05 ENCOUNTER — Other Ambulatory Visit (HOSPITAL_BASED_OUTPATIENT_CLINIC_OR_DEPARTMENT_OTHER): Payer: Self-pay

## 2023-10-09 ENCOUNTER — Other Ambulatory Visit (HOSPITAL_BASED_OUTPATIENT_CLINIC_OR_DEPARTMENT_OTHER): Payer: Self-pay

## 2023-10-09 MED ORDER — WEGOVY 1.7 MG/0.75ML ~~LOC~~ SOAJ
1.7000 mg | SUBCUTANEOUS | 0 refills | Status: DC
  Filled 2023-10-09: qty 3, 28d supply, fill #0

## 2023-12-11 ENCOUNTER — Other Ambulatory Visit (HOSPITAL_BASED_OUTPATIENT_CLINIC_OR_DEPARTMENT_OTHER): Payer: Self-pay

## 2023-12-11 ENCOUNTER — Other Ambulatory Visit (HOSPITAL_COMMUNITY): Payer: Self-pay

## 2023-12-11 MED ORDER — WEGOVY 2.4 MG/0.75ML ~~LOC~~ SOAJ
2.4000 mg | SUBCUTANEOUS | 0 refills | Status: DC
  Filled 2023-12-11: qty 9, 84d supply, fill #0

## 2023-12-12 ENCOUNTER — Other Ambulatory Visit (HOSPITAL_BASED_OUTPATIENT_CLINIC_OR_DEPARTMENT_OTHER): Payer: Self-pay
